# Patient Record
Sex: Male | Born: 1940 | Race: White | Hispanic: No | Marital: Married | Smoking: Former smoker
Health system: Southern US, Community
[De-identification: ages and names within clinical notes are randomized; demographics above are authoritative.]

## PROBLEM LIST (undated history)

## (undated) DIAGNOSIS — I10 Essential (primary) hypertension: Secondary | ICD-10-CM

## (undated) DIAGNOSIS — F410 Panic disorder [episodic paroxysmal anxiety] without agoraphobia: Secondary | ICD-10-CM

## (undated) DIAGNOSIS — N529 Male erectile dysfunction, unspecified: Secondary | ICD-10-CM

## (undated) DIAGNOSIS — J449 Chronic obstructive pulmonary disease, unspecified: Secondary | ICD-10-CM

## (undated) HISTORY — DX: Chronic obstructive pulmonary disease, unspecified: J44.9

## (undated) HISTORY — DX: Male erectile dysfunction, unspecified: N52.9

## (undated) HISTORY — DX: Panic disorder (episodic paroxysmal anxiety): F41.0

## (undated) HISTORY — PX: NO PAST SURGERIES: SHX2092

## (undated) HISTORY — DX: Essential (primary) hypertension: I10

---

## 2003-12-25 ENCOUNTER — Inpatient Hospital Stay (HOSPITAL_COMMUNITY): Admission: EM | Admit: 2003-12-25 | Discharge: 2003-12-28 | Payer: Self-pay | Admitting: Emergency Medicine

## 2013-04-13 ENCOUNTER — Encounter: Payer: Self-pay | Admitting: Pulmonary Disease

## 2013-04-13 ENCOUNTER — Ambulatory Visit (INDEPENDENT_AMBULATORY_CARE_PROVIDER_SITE_OTHER)
Admission: RE | Admit: 2013-04-13 | Discharge: 2013-04-13 | Disposition: A | Discharge status: Home / Self Care | Payer: Medicare Other | Source: Ambulatory Visit | Attending: Pulmonary Disease | Admitting: Pulmonary Disease

## 2013-04-13 ENCOUNTER — Ambulatory Visit (INDEPENDENT_AMBULATORY_CARE_PROVIDER_SITE_OTHER): Payer: Medicare Other | Admitting: Pulmonary Disease

## 2013-04-13 VITALS — BP 140/88 | HR 109 | Temp 97.9°F | Ht 72.0 in | Wt 215.0 lb

## 2013-04-13 DIAGNOSIS — J439 Emphysema, unspecified: Secondary | ICD-10-CM | POA: Insufficient documentation

## 2013-04-13 DIAGNOSIS — R0600 Dyspnea, unspecified: Secondary | ICD-10-CM

## 2013-04-13 DIAGNOSIS — R0609 Other forms of dyspnea: Secondary | ICD-10-CM

## 2013-04-13 DIAGNOSIS — R0989 Other specified symptoms and signs involving the circulatory and respiratory systems: Secondary | ICD-10-CM

## 2013-04-13 NOTE — Patient Instructions (Addendum)
Will check a chest xray today, and call you with results. Will start on Anoro one puff each am only.  Rinse mouth well. Do not use albuterol except for rescue/emergencies.  Before using, sit down, relax, and give it about 4-5 min to see if things improve. Will schedule for breathing studies within next 2-3 weeks, and would like to see you back on the same day to review.

## 2013-04-13 NOTE — Progress Notes (Signed)
  Subjective:    Patient ID: Trevor Carrillo, male    DOB: July 31, 1941, 72 y.o.   MRN: [id number]  HPI The patient is a 72 year old male who I've been asked to see for management of COPD.  The patient has had breathing issues for years, and was diagnosed with "COPD" during a hospitalization approximately 6 years ago.  He feels that his breathing has gotten progressively worse since that time, and now he can give short of breath just walking through his house at a slow pace.  He is unable to go up a flight of stairs without becoming severely short of breath.  He has a long history of smoking for over 40 years, but quit in 2006.  He has never had pulmonary function studies, and has not had a chest x-ray in 5 years.  He currently is on albuterol nebulizer b.i.d., and albuterol rescue inhaler as needed.  He has tried Advair and Spiriva in the past with some success, but then "became immune".  The patient denies having an episode of bronchitis or COPD exacerbation last year, and he has not had to go to the emergency room or the hospital in quite some time.  To his knowledge, he has no history of heart disease, but does not think that he has had any recent workup.  He does have chronic lower extremity edema.  He also states that his weight is gone up about 20 pounds over the last 2 years.   Review of Systems  Constitutional: Negative for fever and unexpected weight change.  HENT: Positive for congestion, rhinorrhea, sneezing and postnasal drip. Negative for ear pain, nosebleeds, sore throat, trouble swallowing, dental problem and sinus pressure.        Allergies  Eyes: Negative for redness and itching.  Respiratory: Positive for cough, chest tightness and shortness of breath. Negative for wheezing.   Cardiovascular: Positive for chest pain ( center chest//sternum). Negative for palpitations and leg swelling.  Gastrointestinal: Negative for nausea and vomiting.  Genitourinary: Negative for dysuria.   Musculoskeletal: Negative for joint swelling.  Skin: Negative for rash.  Neurological: Positive for headaches.  Hematological: Does not bruise/bleed easily.  Psychiatric/Behavioral: Negative for dysphoric mood. The patient is nervous/anxious.        Objective:   Physical Exam Constitutional:  Obese male, no acute distress  HENT:  Nares patent without discharge  Oropharynx without exudate, palate and uvula are elongated and swollen.   Eyes:  Perrla, eomi, no scleral icterus  Neck:  No JVD, no TMG  Cardiovascular:  Normal rate, regular rhythm, no rubs or gallops.  No murmurs        Intact distal pulses, but diminished.   Pulmonary :  decreased breath sounds, no stridor or respiratory distress   No rales, rhonchi, or wheezing  Abdominal:  Soft, nondistended, bowel sounds present.  No tenderness noted.   Musculoskeletal:  1+ lower extremity edema noted.  Lymph Nodes:  No cervical lymphadenopathy noted  Skin:  No cyanosis noted  Neurologic:  Alert, appropriate, moves all 4 extremities without obvious deficit.         Assessment & Plan:

## 2013-04-13 NOTE — Assessment & Plan Note (Signed)
The patient has severe dyspnea on exertion that I suspect is multifactorial.  He probably does have some degree of COPD, but will need pulmonary function studies to define this.  He also has very severe debility and deconditioning, and was barely able to walk even a few feet from a wheelchair to the exam table because of severe weakness and unsteadiness.  He also has severe anxiety at causes him to want to overuse his rescue inhaler, and also turn up his oxygen.  He may benefit from more aggressive treatment with anxiolytics.  Finally, he has significant lower extremity edema, which may be related to right heart failure, or possibly a component of left-sided failure.  Depending upon the results of his PFTs, he may need a cardiac evaluation as well.  I had a long discussion with the patient about long-acting bronchodilators versus short-acting ones.  I would like to try him on a LABA/LAMA combination to see if he sees improvement.

## 2013-04-14 NOTE — Progress Notes (Signed)
Quick Note:  Spoke with patient, informed him of reuslts as listed below per Dr. Shelle Iron Patient verbalized understanding and nothing further needed at this time. ______

## 2013-04-16 ENCOUNTER — Telehealth: Payer: Self-pay | Admitting: Pulmonary Disease

## 2013-04-16 NOTE — Telephone Encounter (Signed)
Pt was seen on 04-13-13 and was started on Anoro. He wanted to clarify if he was to continue his albuterol neb. I advised per patinet instruction he is to use albuterol as follows: Do not use albuterol except for rescue/emergencies. Before using, sit down, relax, and give it about 4-5 min to see if things improve.  I advised he is not to use it on a scheduled basis. Pt states understanding. Carron Curie, CMA

## 2013-05-03 ENCOUNTER — Telehealth: Payer: Self-pay | Admitting: Pulmonary Disease

## 2013-05-03 MED ORDER — UMECLIDINIUM-VILANTEROL 62.5-25 MCG/INH IN AEPB: 1.0000 | INHALATION_SPRAY | RESPIRATORY_TRACT | Status: DC

## 2013-05-03 NOTE — Telephone Encounter (Signed)
Spoke with patient, Patient last seen 04/13/13  Patient Instructions    Will check a chest xray today, and call you with results.  Will start on Anoro one puff each am only. Rinse mouth well.  Do not use albuterol except for rescue/emergencies. Before using, sit down, relax, and give it about 4-5 min to see if things improve.  Will schedule for breathing studies within next 2-3 weeks, and would like to see you back on the same day to review.    Patient was scheduled to return 05/10/13 w PFT, however patient has to cancel 05/10/13 OV will keep PFT Patient wants to know if Dr. Shelle Iron wants him to continue on Anoro until new appt 05/25/13, if so patient will need more samples as he states he has enough to last him until next Tuesday (6/16)  Dr. Shelle Iron pleasde advise, thank you

## 2013-05-03 NOTE — Telephone Encounter (Signed)
Keep apptm for pfts.  If anoro is helping him, would call in a prescription.

## 2013-05-03 NOTE — Telephone Encounter (Signed)
Spoke with pt and notified of recs per [facility] Rx was sent to pharm for Anoro Nothing further needed

## 2013-05-10 ENCOUNTER — Ambulatory Visit (INDEPENDENT_AMBULATORY_CARE_PROVIDER_SITE_OTHER): Payer: Medicare Other | Admitting: Pulmonary Disease

## 2013-05-10 ENCOUNTER — Ambulatory Visit: Payer: Medicare Other

## 2013-05-10 ENCOUNTER — Ambulatory Visit: Payer: Medicare Other | Admitting: Pulmonary Disease

## 2013-05-10 DIAGNOSIS — R0989 Other specified symptoms and signs involving the circulatory and respiratory systems: Secondary | ICD-10-CM

## 2013-05-10 DIAGNOSIS — R0600 Dyspnea, unspecified: Secondary | ICD-10-CM

## 2013-05-10 NOTE — Progress Notes (Signed)
PFT done today. 

## 2013-05-20 ENCOUNTER — Telehealth: Payer: Self-pay | Admitting: Pulmonary Disease

## 2013-05-20 NOTE — Telephone Encounter (Signed)
Pt was supposed to see me the same day as his pfts.  Needs ov to review pfts and check on things since starting inhaler.

## 2013-05-20 NOTE — Telephone Encounter (Signed)
Patient did have appt 05/10/13 after PFT but was moved d/t [facility] being out of office.  Patient was rescheduled to 05/25/13 @ 415

## 2013-05-25 ENCOUNTER — Ambulatory Visit (INDEPENDENT_AMBULATORY_CARE_PROVIDER_SITE_OTHER): Payer: Medicare Other | Admitting: Pulmonary Disease

## 2013-05-25 ENCOUNTER — Encounter: Payer: Self-pay | Admitting: Pulmonary Disease

## 2013-05-25 VITALS — BP 138/82 | HR 112 | Temp 98.3°F | Ht 73.0 in | Wt 220.0 lb

## 2013-05-25 DIAGNOSIS — J438 Other emphysema: Secondary | ICD-10-CM

## 2013-05-25 DIAGNOSIS — J439 Emphysema, unspecified: Secondary | ICD-10-CM

## 2013-05-25 MED ORDER — BUDESONIDE 0.25 MG/2ML IN SUSP: 0.2500 mg | Freq: Two times a day (BID) | RESPIRATORY_TRACT | Status: DC

## 2013-05-25 MED ORDER — IPRATROPIUM-ALBUTEROL 0.5-2.5 (3) MG/3ML IN SOLN: 3.0000 mL | RESPIRATORY_TRACT | Status: DC

## 2013-05-25 NOTE — Assessment & Plan Note (Signed)
The patient has severe airflow obstruction based on his recent PFTs.  We are still struggling to find a bronchodilator regimen that he will be tolerated, and at the same time not put him at risk for overuse of beta adrenergic drugs.  He clearly feels the need to take something every 4-6 hours, and therefore it may be that long-acting medications are not going to work for him.  At this point I would like to try him on duo nebs every 4-6 hours scheduled, and also add inhaled corticosteroids given the severely of his lung disease.  I also think he's going to have to work aggressively on weight loss and conditioning, and that his fluid status will need to be watched closely.

## 2013-05-25 NOTE — Progress Notes (Signed)
  Subjective:    Patient ID: Trevor Carrillo, male    DOB: Feb 13, 1941, 72 y.o.   MRN: [id number]  HPI The patient comes in today for followup of his recent pulmonary function studies.  He was found to have very severe airflow obstruction, with an FEV1 of 0.75 L, and an FEV1 percent of 38.  He did have a 15% improvement with bronchodilators.  He had no evidence for restriction, but severe air trapping noted.  His DLCO was severely reduced.  I have gone over the medications with him in detail, and answered all of his questions.  At the last visit, he was started on anoro, but the patient feels this is causing significant cough and shortness of breath.  He has continued to use his albuterol excessively.   Review of Systems  Constitutional: Negative for fever and unexpected weight change.  HENT: Negative for ear pain, nosebleeds, congestion, sore throat, rhinorrhea, sneezing, trouble swallowing, dental problem, postnasal drip and sinus pressure.   Eyes: Negative for redness and itching.  Respiratory: Negative for cough, chest tightness, shortness of breath and wheezing.   Cardiovascular: Negative for palpitations and leg swelling.  Gastrointestinal: Negative for nausea and vomiting.  Genitourinary: Negative for dysuria.  Musculoskeletal: Negative for joint swelling.  Skin: Negative for rash.  Neurological: Negative for headaches.  Hematological: Does not bruise/bleed easily.  Psychiatric/Behavioral: Negative for dysphoric mood. The patient is not nervous/anxious.        Objective:   Physical Exam Obese male in no acute distress Nose without purulence or discharge noted Neck without lymphadenopathy or thyromegaly Chest with a few scattered crackles, but very decreased breath sounds but no wheezing. Current exam with regular rate and rhythm Lower extremities with 1+ edema, no cyanosis Alert and oriented, moves all 4 extremities.       Assessment & Plan:

## 2013-05-25 NOTE — Patient Instructions (Addendum)
Stop all breathing medication. Start duoneb in your nebulizer 4 times a day, and can take 2 extra if having a bad day. Start budesonide 0.25 mg in nebulizer 4 times a day.  Do not add this to the extra treatments. Can use albuterol inhaler for emergencies only if away from home.   Work on weight loss followup with me in 3mos.

## 2013-05-26 ENCOUNTER — Telehealth: Payer: Self-pay | Admitting: Pulmonary Disease

## 2013-05-26 NOTE — Telephone Encounter (Signed)
[facility] and then try and transfer to [facility]. Wife to call once pt gets through first month supply to see if [facility] will accept the Rx.  Rx for both the Pulmicort and Duoneb [facility] at [phone]

## 2013-05-26 NOTE — Telephone Encounter (Signed)
ATC x 1  NA  We have the handwritten Rx signed by [facility] here at the office.  I tried calling the patient to let them know of the options they have for these rxs.  Melissa with [facility] states that they are having to redo contract with [facility] and at this time they are not accepting Rx from this pt insurance company. This issue should be fixed within ONE MONTH.   Options: 1) [facility].  2) [facility] 1 month until [facility] get their pharmacy issues straightened out then we may start refilling through [facility] 3) Get a new DME for this medication perm. 4) Get new DME to cover meds until [facility] gets the new pharmacy contract.

## 2013-05-31 ENCOUNTER — Telehealth: Payer: Self-pay | Admitting: Pulmonary Disease

## 2013-05-31 NOTE — Telephone Encounter (Signed)
He can mix budesonide with the duoneb at his 4 scheduled treatments.

## 2013-05-31 NOTE — Telephone Encounter (Signed)
Pt wants to make sure that he can mix duoneb and budesonide to be used at the same time.  He is aware not to add budesonide with extra duoneb treatments but wants to make usre it is ok to mix the two.  Please advise.  Per KC at last ov:   Stop all breathing medication. Start duoneb in your nebulizer 4 times a day, and can take 2 extra if having a bad day. Start budesonide 0.25 mg in nebulizer 4 times a day.  Do not add this to the extra treatments. Can use albuterol inhaler for emergencies only if away from home.    Work on weight loss followup with me in 3mos.

## 2013-05-31 NOTE — Telephone Encounter (Signed)
Spouse aware of KC recs. She voiced her understanding and needed nothing further

## 2013-06-16 ENCOUNTER — Encounter: Payer: Self-pay | Admitting: Pulmonary Disease

## 2013-08-25 ENCOUNTER — Ambulatory Visit (INDEPENDENT_AMBULATORY_CARE_PROVIDER_SITE_OTHER): Payer: Medicare Other | Admitting: Pulmonary Disease

## 2013-08-25 ENCOUNTER — Encounter: Payer: Self-pay | Admitting: Pulmonary Disease

## 2013-08-25 VITALS — BP 124/62 | HR 112 | Temp 97.8°F

## 2013-08-25 DIAGNOSIS — J438 Other emphysema: Secondary | ICD-10-CM

## 2013-08-25 DIAGNOSIS — J439 Emphysema, unspecified: Secondary | ICD-10-CM

## 2013-08-25 DIAGNOSIS — Z23 Encounter for immunization: Secondary | ICD-10-CM

## 2013-08-25 NOTE — Assessment & Plan Note (Signed)
The patient has severe COPD, but appears to be near his usual baseline.  I've asked him to continue his nebulized medication since he seems to be doing better on days.  He is very weak and debilitated, and I have recommended pulmonary rehabilitation.  The patient decided against this.  He tells me that he is using 5+ liters of oxygen at night, and I have cautioned him about excessive oxygen use.  Would like to check his overnight oximetry to verify his oxygen need.

## 2013-08-25 NOTE — Patient Instructions (Addendum)
Will have your home care company check your oxygen level overnight on 4 liters.  I will call you once I receive the download. Try and make your first breathing treatment in the am as soon as you wake up. Will give you the flu shot today. Work on weight loss and some type of exercise program.  Think about pulmonary rehab program at [facility].  followup with me in 4mos if doing well.

## 2013-08-25 NOTE — Progress Notes (Signed)
  Subjective:    Patient ID: Trevor Carrillo, male    DOB: [birth year], 72 y.o.   MRN: [id number]  HPI Patient comes in today for followup of his known COPD with chronic respiratory failure.  He had a somewhat difficult summer because of the high heat and humidity, but feels that his breathing is the same now as it was approximately 3-4 months ago.  He has not had any chest congestion or purulent mucus.  He is waking up in the morning with significant increased shortness of breath, but uses his albuterol inhaler rather than his nebulizer machine.  I have asked him to make his first treatment of the day when he first awakens.   Review of Systems  Constitutional: Negative for fever and unexpected weight change.  HENT: Negative for ear pain, nosebleeds, congestion, sore throat, rhinorrhea, sneezing, trouble swallowing, dental problem, postnasal drip and sinus pressure.   Eyes: Negative for redness and itching.  Respiratory: Positive for shortness of breath. Negative for cough, chest tightness and wheezing.   Cardiovascular: Negative for palpitations and leg swelling.  Gastrointestinal: Negative for nausea and vomiting.  Genitourinary: Negative for dysuria.  Musculoskeletal: Negative for joint swelling.  Skin: Negative for rash.  Neurological: Negative for headaches.  Hematological: Does not bruise/bleed easily.  Psychiatric/Behavioral: Positive for sleep disturbance ( difficulty breathing). Negative for dysphoric mood. The patient is not nervous/anxious.        Objective:   Physical Exam Obese male in no acute distress Nose without purulence or discharge noted Neck without lymphadenopathy or thyromegaly Chest with decreased breath sounds, but no active wheezing Cardiac exam with distant heart sounds but regular Lower extremities with 1+ edema, no cyanosis Alert and oriented, moves all 4 extremities.       Assessment & Plan:

## 2013-09-02 ENCOUNTER — Telehealth: Payer: Self-pay | Admitting: *Deleted

## 2013-09-02 NOTE — Telephone Encounter (Signed)
ONO results have been received and placed in KC Braylie Badami folder for review. Please advise, thanks.   

## 2013-09-06 ENCOUNTER — Telehealth: Payer: Self-pay | Admitting: Pulmonary Disease

## 2013-09-06 DIAGNOSIS — J439 Emphysema, unspecified: Secondary | ICD-10-CM

## 2013-09-06 NOTE — Telephone Encounter (Signed)
Please let pt know that his overnight test shows adequate oxygen level overnight on 4L oxygen.  Will stay at this level.

## 2013-09-07 NOTE — Telephone Encounter (Signed)
Results have been explained to patient, pt expressed understanding. Nothing further needed.  

## 2013-10-11 ENCOUNTER — Telehealth: Payer: Self-pay | Admitting: Pulmonary Disease

## 2013-10-11 DIAGNOSIS — J449 Chronic obstructive pulmonary disease, unspecified: Secondary | ICD-10-CM

## 2013-10-11 NOTE — Telephone Encounter (Signed)
I spoke with spouse. She is wanting to know how much [facility] will charge pt for budesonide. She reports she was told to call us to find out. Please advise PCC's thanks

## 2013-10-11 NOTE — Telephone Encounter (Signed)
According to med list pt has albuterol neb, duoneb, and budesonide on there. Per Bjorn Loser the spouse states pt has all 3. Please advise KC thanks

## 2013-10-11 NOTE — Telephone Encounter (Signed)
Called and spoke with Synetta Fail at [facility] and she advised that a 1 month supply with pt's insurance would be around $60 give or take. No way to be sure until they ran the Rx.  Called and spoke with patient's wife and she stated that was much better than what they were paying. Wife is requesting that all his nebulizer medications be sent to [facility]. Please place a referral for DME ([facility]) to provide nebulizer meds along with Rx and send order with Rx to [facility]. Thanks. Rhonda J Cobb

## 2013-10-12 NOTE — Telephone Encounter (Signed)
The budesonide is 4 times a day with duoneb, sorry.

## 2013-10-12 NOTE — Telephone Encounter (Signed)
Pt just needs duoneb to use 4 times a day , and 2 additional if needed.  Does not need plain albuterol Use budesonide twice a day only mixed in with duoneb

## 2013-10-13 MED ORDER — BUDESONIDE 0.25 MG/2ML IN SUSP: 0.2500 mg | Freq: Four times a day (QID) | RESPIRATORY_TRACT | Status: DC

## 2013-10-13 MED ORDER — IPRATROPIUM-ALBUTEROL 0.5-2.5 (3) MG/3ML IN SOLN: 3.0000 mL | Freq: Four times a day (QID) | RESPIRATORY_TRACT | Status: DC

## 2013-10-13 NOTE — Telephone Encounter (Signed)
I have printed off RX's and placed on [facility] desk for signature then will give to PCC's. Please advise KC thanks

## 2013-12-24 ENCOUNTER — Ambulatory Visit: Payer: Medicare Other | Admitting: Pulmonary Disease

## 2014-01-18 ENCOUNTER — Ambulatory Visit: Payer: Self-pay | Admitting: Pulmonary Disease

## 2014-02-07 ENCOUNTER — Encounter: Payer: Self-pay | Admitting: Pulmonary Disease

## 2014-02-07 ENCOUNTER — Ambulatory Visit (INDEPENDENT_AMBULATORY_CARE_PROVIDER_SITE_OTHER): Payer: Medicare Other | Admitting: Pulmonary Disease

## 2014-02-07 VITALS — BP 138/76 | HR 108 | Temp 98.6°F

## 2014-02-07 DIAGNOSIS — J439 Emphysema, unspecified: Secondary | ICD-10-CM

## 2014-02-07 DIAGNOSIS — J438 Other emphysema: Secondary | ICD-10-CM

## 2014-02-07 DIAGNOSIS — J9610 Chronic respiratory failure, unspecified whether with hypoxia or hypercapnia: Secondary | ICD-10-CM | POA: Insufficient documentation

## 2014-02-07 MED ORDER — LORAZEPAM 2 MG PO TABS: 1.0000 mg | ORAL_TABLET | Freq: Every day | ORAL | Status: DC

## 2014-02-07 MED ORDER — PREDNISONE 10 MG PO TABS: ORAL_TABLET | ORAL | Status: DC

## 2014-02-07 NOTE — Addendum Note (Signed)
Addended by: Maisie FusGREEN, Malaney Mcbean M on: 02/07/2014 05:12 PM   Modules accepted: Orders

## 2014-02-07 NOTE — Progress Notes (Signed)
   Subjective:    Patient ID: Trevor Carrillo, male    DOB: 04/28/1941, 73 y.o.   MRN: [id number]  HPI The patient comes in today for followup of his known COPD with chronic respiratory failure. He has very severe disease with a lot of anxiety, and tends to overuse his beta agonist. He was switched completely over to nebulized bronchodilators at the last visit, but has continued to use albuterol on top of duoneb.  I have cautioned him again about this. He denies any significant congestion, cough, or purulent mucus. He feels that his breathing is a little worse than baseline, but his oxygenation is more than adequate.   Review of Systems  Constitutional: Negative for fever and unexpected weight change.  HENT: Negative for congestion, dental problem, ear pain, nosebleeds, postnasal drip, rhinorrhea, sinus pressure, sneezing, sore throat and trouble swallowing.   Eyes: Negative for redness and itching.  Respiratory: Positive for cough, chest tightness, shortness of breath and wheezing.   Cardiovascular: Positive for chest pain ( with SOB). Negative for palpitations and leg swelling.  Gastrointestinal: Negative for nausea and vomiting.  Genitourinary: Negative for dysuria.  Musculoskeletal: Negative for joint swelling.  Skin: Negative for rash.  Neurological: Negative for headaches.  Hematological: Does not bruise/bleed easily.  Psychiatric/Behavioral: Negative for dysphoric mood. The patient is not nervous/anxious.        Objective:   Physical Exam Obese male in no acute distress Nose without purulence or discharge noted Neck without lymphadenopathy or thyromegaly Chest with decreased breath sounds throughout, no wheezing Cardiac exam with regular rate and rhythm, 2/6 systolic murmur Lower extremities with mild edema, no cyanosis Alert and oriented, moves all 4 extremities.       Assessment & Plan:

## 2014-02-07 NOTE — Assessment & Plan Note (Signed)
The patient has severe disease, and has a significant anxiety component to his symptoms. I will try to increase his lorazepam by adding a milligram dose at lunch, and will also start on prednisone chronically at 10 mg a day for his breathing. I have cautioned him again about over using his beta agonist, and have also recommended that he attend pulmonary rehabilitation. At this point, there is very little that we can do except starting him on narcotics for dyspnea relief if he does not respond to this regimen. He would need to be on hospice at that point.

## 2014-02-07 NOTE — Patient Instructions (Addendum)
Do not ever use plain albuterol in your nebulizer.  That is what the duoneb is for.  You can take duoneb 4 times a day, then 2 additional if having a bad day.  Take lorazepam 2mg  in am and pm as you are doing, but would add a 1mg  dose (1/2 pill) around 12-1pm each day Will start on prednisone 10mg  each am on full stomach Please consider going to [facility] rehab program [facility] for your breathing.  Let me know.  followup with me again in 2mos to check on things.

## 2014-04-13 ENCOUNTER — Ambulatory Visit: Payer: Medicare Other | Admitting: Pulmonary Disease

## 2014-05-02 ENCOUNTER — Encounter: Payer: Self-pay | Admitting: Pulmonary Disease

## 2014-05-02 ENCOUNTER — Ambulatory Visit (INDEPENDENT_AMBULATORY_CARE_PROVIDER_SITE_OTHER): Payer: Medicare Other | Admitting: Pulmonary Disease

## 2014-05-02 VITALS — BP 132/88 | HR 118 | Temp 98.0°F

## 2014-05-02 DIAGNOSIS — J9610 Chronic respiratory failure, unspecified whether with hypoxia or hypercapnia: Secondary | ICD-10-CM

## 2014-05-02 DIAGNOSIS — J438 Other emphysema: Secondary | ICD-10-CM

## 2014-05-02 DIAGNOSIS — J439 Emphysema, unspecified: Secondary | ICD-10-CM

## 2014-05-02 MED ORDER — LORAZEPAM 1 MG PO TABS: ORAL_TABLET | ORAL | Status: DC

## 2014-05-02 MED ORDER — ALBUTEROL SULFATE HFA 108 (90 BASE) MCG/ACT IN AERS: 2.0000 | INHALATION_SPRAY | Freq: Four times a day (QID) | RESPIRATORY_TRACT | Status: DC | PRN

## 2014-05-02 NOTE — Progress Notes (Signed)
   Subjective:    Patient ID: Trevor Carrillo, male    DOB: 1941-07-02, 73 y.o.   MRN: [id number]  HPI The patient comes in today for followup of his known severe COPD with chronic respiratory failure. He was started on chronic prednisone at 10 mg a day at the last visit, and we were also working on improving his anxiety. Unfortunately, he never got his extra dose of Ativan, and this will need to be taken care of this visit. He denies any significant cough or congestion, and is not bringing up purulent mucus.   Review of Systems  Constitutional: Negative for fever and unexpected weight change.  HENT: Negative for congestion, dental problem, ear pain, nosebleeds, postnasal drip, rhinorrhea, sinus pressure, sneezing, sore throat and trouble swallowing.   Eyes: Negative for redness and itching.  Respiratory: Positive for cough and shortness of breath. Negative for chest tightness and wheezing.   Cardiovascular: Negative for palpitations and leg swelling.  Gastrointestinal: Negative for nausea and vomiting.  Genitourinary: Negative for dysuria.  Musculoskeletal: Negative for joint swelling.  Skin: Negative for rash.  Neurological: Negative for headaches.  Hematological: Does not bruise/bleed easily.  Psychiatric/Behavioral: Negative for dysphoric mood. The patient is not nervous/anxious.        Objective:   Physical Exam Obese male in no acute distress Nose without purulence or discharge noted Neck without lymphadenopathy or thyromegaly Chest with very diminished breath sounds, no active wheezing Cardiac exam with regular rate and rhythm Lower extremities with mild edema, no cyanosis Alert and oriented, moves all 4 extremities.       Assessment & Plan:

## 2014-05-02 NOTE — Assessment & Plan Note (Signed)
The patient appears to be at his usual baseline from a COPD and chronic respiratory failure standpoint. I have asked him to continue on his nebulized bronchodilators, as well as his chronic prednisone at 10 mg a day. Will try and control his anxiety a little better with an extra dose at midday. The patient has end-stage disease, and will probably need hospice in the very near future.

## 2014-05-02 NOTE — Patient Instructions (Addendum)
No change in medications Will print a prescription for your ativan dose of 1mg  to take midday.  (#30, 6 fills) Try and stay active followup with me again in 51mos.

## 2014-05-02 NOTE — Addendum Note (Signed)
Addended by: Nicanor Alcon on: 05/02/2014 05:15 PM   Modules accepted: Orders

## 2014-05-02 NOTE — Addendum Note (Signed)
Addended by: Nicanor Alcon on: 05/02/2014 05:24 PM   Modules accepted: Orders

## 2014-05-13 IMAGING — CR DG CHEST 2V
2 series · 2 of 2 positions shown · non-contrast
Comparison: Chest radiograph 12/26/2003

CLINICAL DATA: Short of breath, dyspnea

CHEST - 2 VIEW

[view not recorded (1 of 2)]
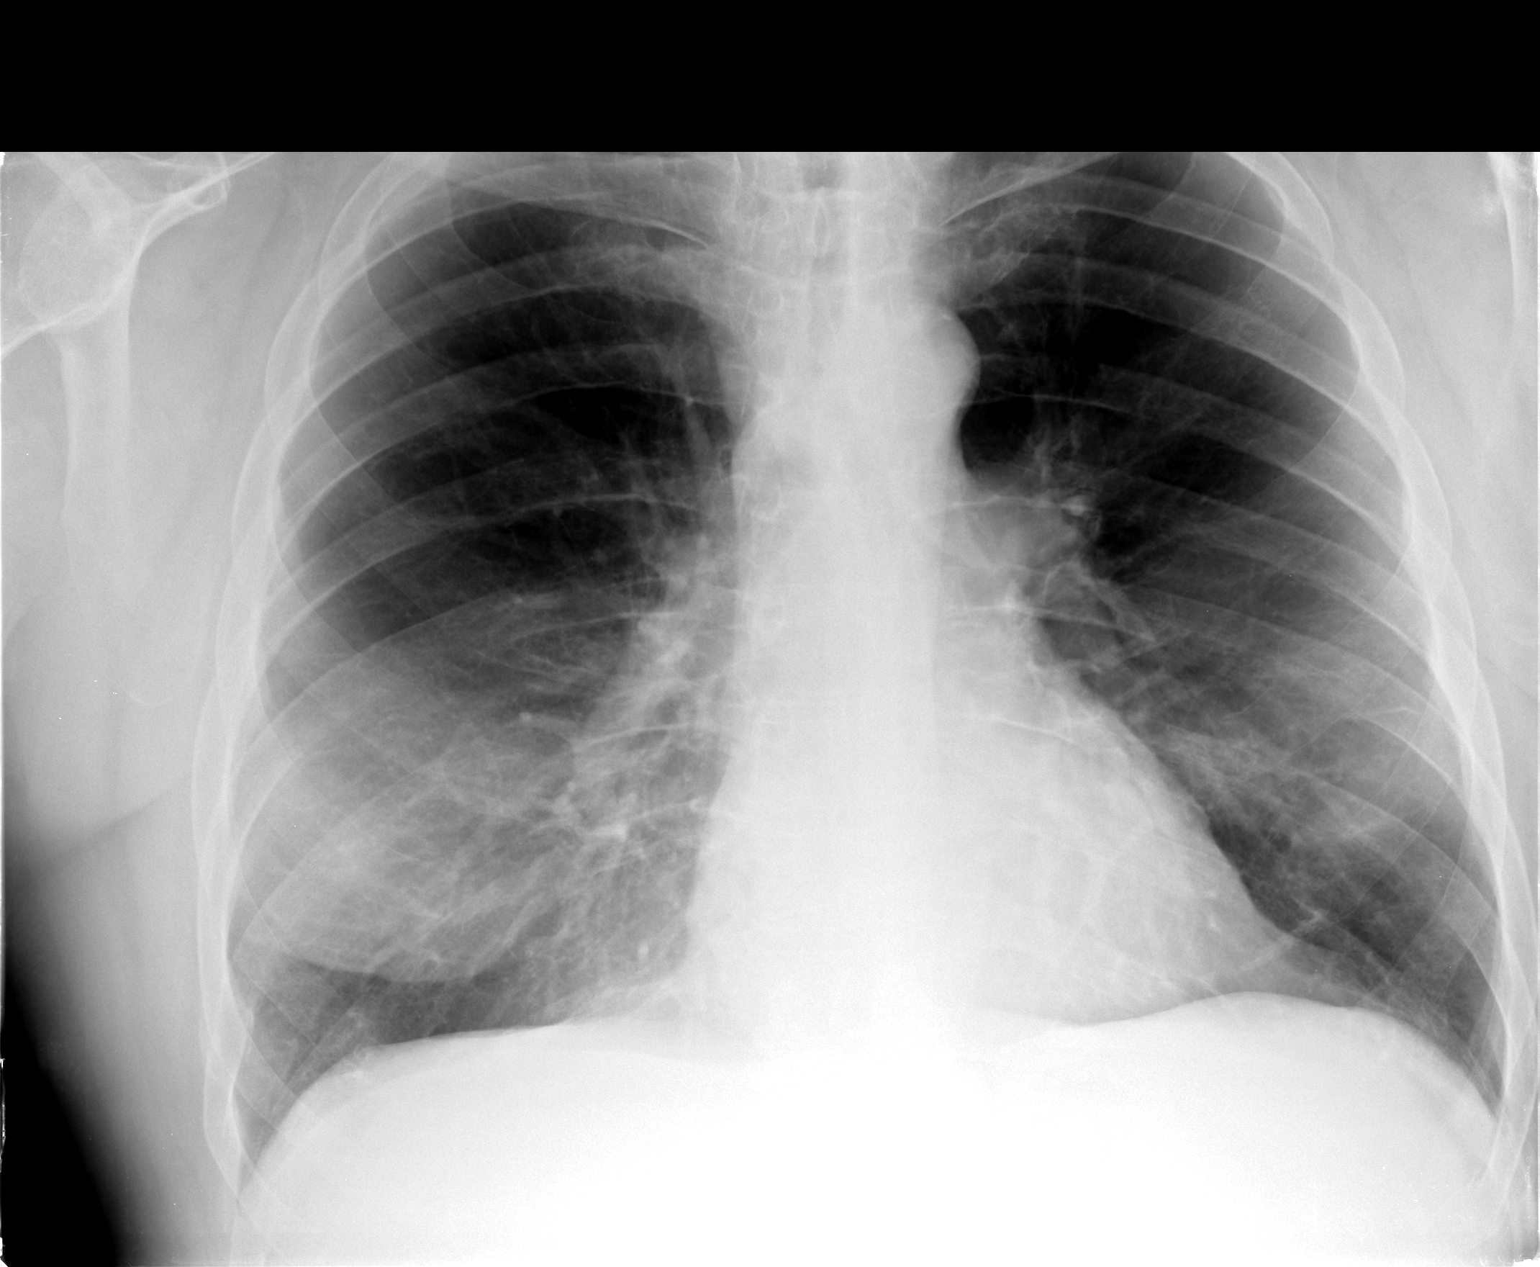

[view not recorded (2 of 2)]
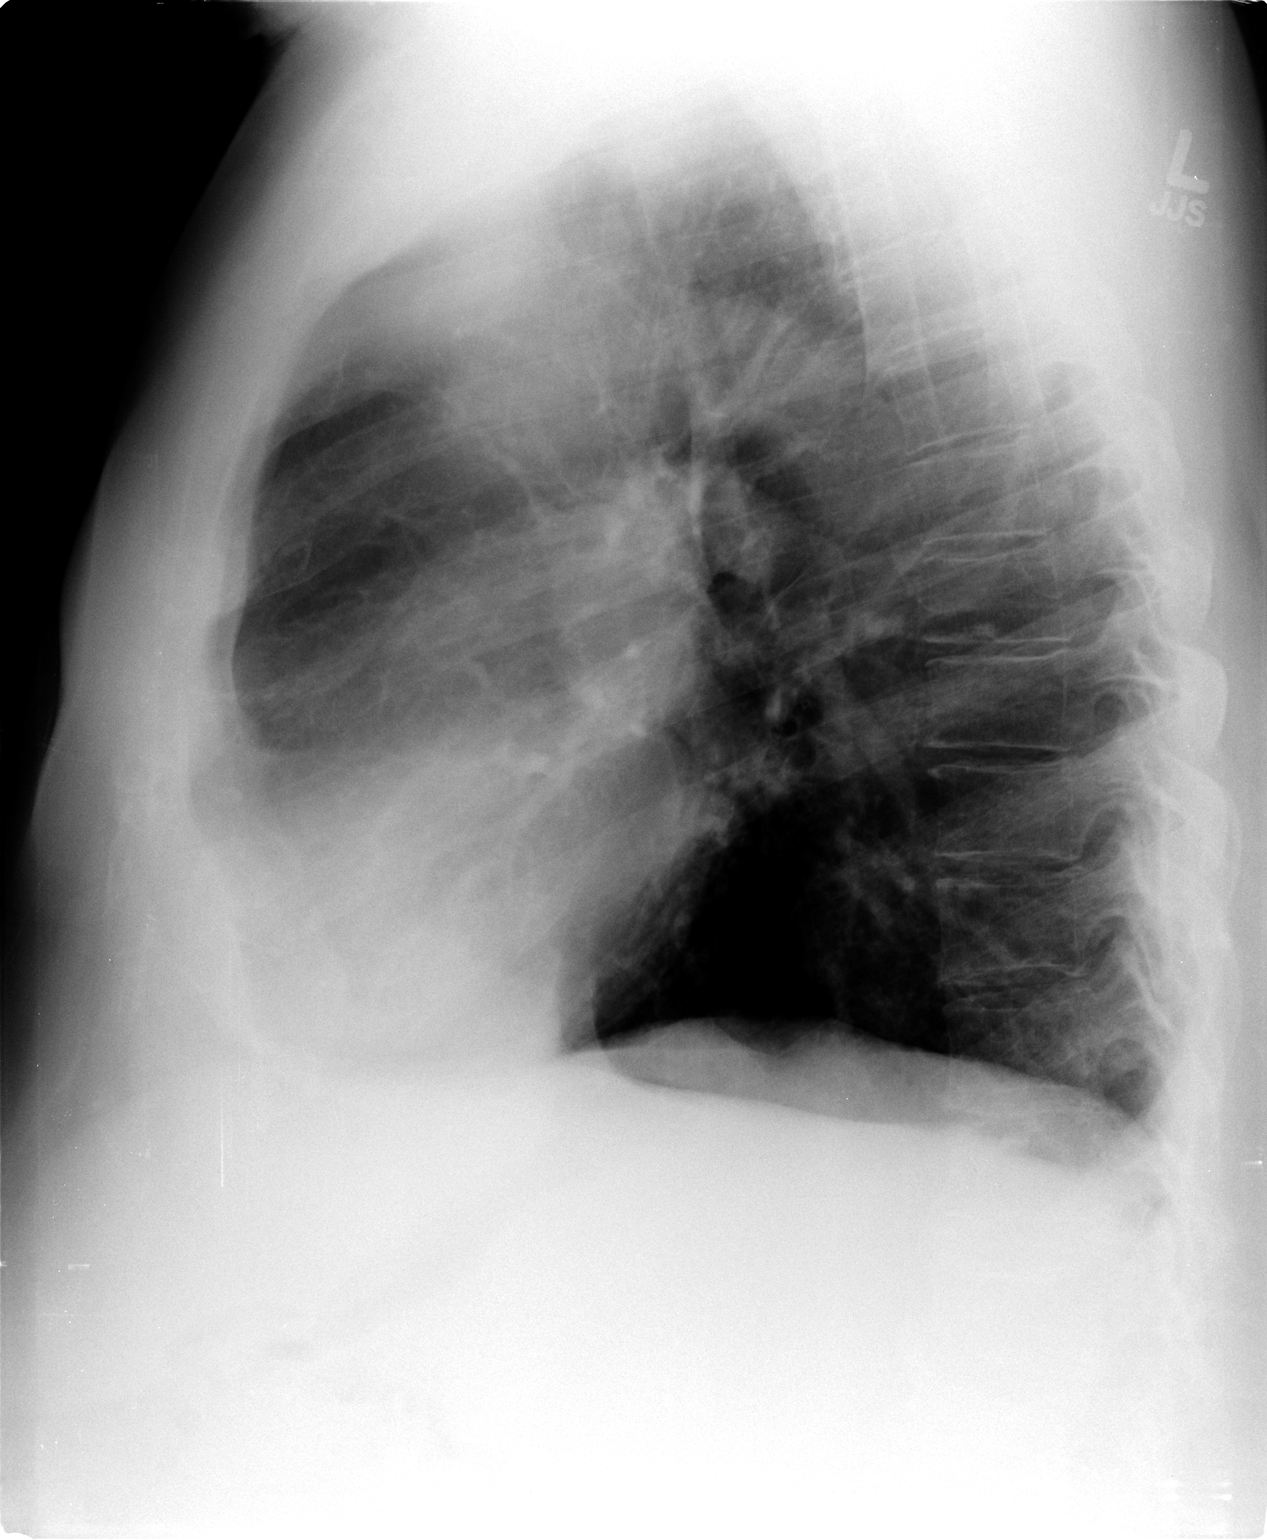

[2 of 2 positions shown; findings below may reference images not displayed]

FINDINGS: Normal cardiac silhouette.  Lungs are hyperinflated with
flattening of the hemidiaphragms.  No effusion, infiltrate, or
pneumothorax.
IMPRESSION: Emphysematous change.  No acute findings.

## 2014-05-18 ENCOUNTER — Other Ambulatory Visit: Payer: Self-pay | Admitting: Pulmonary Disease

## 2014-05-31 ENCOUNTER — Other Ambulatory Visit: Payer: Self-pay | Admitting: Pulmonary Disease

## 2014-06-01 ENCOUNTER — Other Ambulatory Visit: Payer: Self-pay | Admitting: Pulmonary Disease

## 2014-06-03 ENCOUNTER — Telehealth: Payer: Self-pay | Admitting: Pulmonary Disease

## 2014-06-03 MED ORDER — BUDESONIDE 0.25 MG/2ML IN SUSP: 0.2500 mg | Freq: Four times a day (QID) | RESPIRATORY_TRACT | Status: DC

## 2014-06-03 MED ORDER — ALBUTEROL SULFATE HFA 108 (90 BASE) MCG/ACT IN AERS: 2.0000 | INHALATION_SPRAY | Freq: Four times a day (QID) | RESPIRATORY_TRACT | Status: DC | PRN

## 2014-06-03 NOTE — Telephone Encounter (Signed)
According to epic this was already called in.  I called made spouse aware. Nothing further needed

## 2014-06-03 NOTE — Telephone Encounter (Signed)
I called pt. She reports she picked up her [facility]. She reports she [facility]olin [facility] and [facility] neb [facility]. I have done so. Nothing further needed

## 2014-09-01 ENCOUNTER — Telehealth: Payer: Self-pay | Admitting: Pulmonary Disease

## 2014-09-01 ENCOUNTER — Other Ambulatory Visit: Payer: Self-pay | Admitting: Pulmonary Disease

## 2014-09-01 MED ORDER — ALBUTEROL SULFATE HFA 108 (90 BASE) MCG/ACT IN AERS: 2.0000 | INHALATION_SPRAY | Freq: Four times a day (QID) | RESPIRATORY_TRACT | Status: DC | PRN

## 2014-09-01 NOTE — Telephone Encounter (Signed)
Pt aware rx has been sent. Nothing further needed at this time.

## 2014-09-01 NOTE — Telephone Encounter (Signed)
ATC line rang several times and no answer, no VM WCB  RX sent in for ventolin

## 2014-09-05 ENCOUNTER — Ambulatory Visit (INDEPENDENT_AMBULATORY_CARE_PROVIDER_SITE_OTHER): Payer: Medicare Other | Admitting: Pulmonary Disease

## 2014-09-05 ENCOUNTER — Encounter: Payer: Self-pay | Admitting: Pulmonary Disease

## 2014-09-05 VITALS — BP 122/62 | HR 117 | Temp 98.1°F

## 2014-09-05 DIAGNOSIS — J9611 Chronic respiratory failure with hypoxia: Secondary | ICD-10-CM

## 2014-09-05 DIAGNOSIS — J439 Emphysema, unspecified: Secondary | ICD-10-CM

## 2014-09-05 NOTE — Progress Notes (Signed)
   Subjective:    Patient ID: Renita PapaDodson R Kotarski, male    DOB: 05/26/1941, 73 y.o.   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 He is prednisone dependent, and was changed over to nebulized bronchodilators. He also has a severe anxiety component to his illness, and is doing better with the addition of well to trying to his benzodiazepines. Since the last visit, he feels things have stabilized, although he has severe dyspnea with any activity. His wife is concerned because his blood sugars have been in the 170 range on chronic prednisone.   Review of Systems  Constitutional: Negative for fever and unexpected weight change.  HENT: Negative for congestion, dental problem, ear pain, nosebleeds, postnasal drip, rhinorrhea, sinus pressure, sneezing, sore throat and trouble swallowing.   Eyes: Negative for redness and itching.  Respiratory: Positive for shortness of breath. Negative for cough, chest tightness and wheezing.   Cardiovascular: Negative for palpitations and leg swelling.  Gastrointestinal: Negative for nausea and vomiting.  Genitourinary: Negative for dysuria.  Musculoskeletal: Negative for joint swelling.  Skin: Negative for rash.  Neurological: Negative for headaches.  Hematological: Does not bruise/bleed easily.  Psychiatric/Behavioral: Negative for dysphoric mood. The patient is not nervous/anxious.        Objective:   Physical Exam Obese male in no acute distress Nose without purulence or discharge noted Neck without lymphadenopathy or thyromegaly Chest with very diminished breath sounds throughout, no wheezing Cardiac exam with regular rate and rhythm Lower extremities 1+ ankle edema, no cyanosis Alert and oriented, moves all 4 extremities.       Assessment & Plan:

## 2014-09-05 NOTE — Assessment & Plan Note (Signed)
The patient appears to be at a stable baseline from the last visit on chronic prednisone, nebulized bronchodilators, and a very aggressive anxiety treatment regimen. I have asked him to do everything he can to try and drop some weight, and also to try and walk behind a walker in his home as much as possible. I will decrease his prednisone down to 5 mg a day and see how he responds.

## 2014-09-05 NOTE — Patient Instructions (Signed)
Decrease prednisone to 5mg  a day since it may be affecting your blood sugars.  If you see your breathing getting worse, can try 7.5mg  a day . No change in your breathing medications. Try and get some kind of activity, and work on weight loss followup with me again in 4mos.

## 2014-09-08 ENCOUNTER — Telehealth: Payer: Self-pay | Admitting: Pulmonary Disease

## 2014-09-08 MED ORDER — PREDNISONE 10 MG PO TABS: ORAL_TABLET | ORAL | Status: DC

## 2014-09-08 NOTE — Telephone Encounter (Signed)
Spoke with pt's wife and advised that rx for Prednisone was sent to pharmacy per pt request.

## 2014-12-01 ENCOUNTER — Telehealth: Payer: Self-pay | Admitting: Pulmonary Disease

## 2014-12-01 MED ORDER — ALBUTEROL SULFATE HFA 108 (90 BASE) MCG/ACT IN AERS
2.0000 | INHALATION_SPRAY | Freq: Four times a day (QID) | RESPIRATORY_TRACT | Status: DC | PRN
Start: 2014-12-01 — End: 2015-01-04

## 2014-12-01 MED ORDER — IPRATROPIUM-ALBUTEROL 0.5-2.5 (3) MG/3ML IN SOLN: RESPIRATORY_TRACT | Status: DC

## 2014-12-01 NOTE — Telephone Encounter (Signed)
Called spoke with pt spouse. She needs duoneb sent to [facility] and pt albuterol inhaler [facility] to [facility]. I have done so. Nothing further needed

## 2015-01-03 ENCOUNTER — Ambulatory Visit (INDEPENDENT_AMBULATORY_CARE_PROVIDER_SITE_OTHER): Payer: Medicare Other | Admitting: Pulmonary Disease

## 2015-01-03 ENCOUNTER — Encounter: Payer: Self-pay | Admitting: Pulmonary Disease

## 2015-01-03 VITALS — BP 134/76 | HR 113 | Temp 97.6°F

## 2015-01-03 DIAGNOSIS — J438 Other emphysema: Secondary | ICD-10-CM

## 2015-01-03 DIAGNOSIS — J9611 Chronic respiratory failure with hypoxia: Secondary | ICD-10-CM

## 2015-01-03 NOTE — Progress Notes (Signed)
   Subjective:    Patient ID: Trevor Carrillo, male    DOB: 09/24/1941, 74 y.o.   MRN: [id number]  HPI The patient comes in today for follow-up of his known end-stage COPD with chronic respiratory failure. He has actually remained fairly stable from the last visit, and continues on chronic prednisone and an anxiolytic. He has not had an acute exacerbation or pulmonary infection according to his history.  He continues on his oxygen therapy, and feels that his dyspnea on exertion continues to be an issue. Denies any significant cough or mucus.   Review of Systems  Constitutional: Negative for fever and unexpected weight change.  HENT: Negative for congestion, dental problem, ear pain, nosebleeds, postnasal drip, rhinorrhea, sinus pressure, sneezing, sore throat and trouble swallowing.   Eyes: Negative for redness and itching.  Respiratory: Positive for cough and shortness of breath. Negative for chest tightness and wheezing.   Cardiovascular: Negative for palpitations and leg swelling.  Gastrointestinal: Negative for nausea and vomiting.  Genitourinary: Negative for dysuria.  Musculoskeletal: Negative for joint swelling.  Skin: Negative for rash.  Neurological: Negative for headaches.  Hematological: Does not bruise/bleed easily.  Psychiatric/Behavioral: Negative for dysphoric mood. The patient is not nervous/anxious.        Objective:   Physical Exam Obese male in no acute distress Nose without purulence or discharge noted Neck without lymphadenopathy or thyromegaly Chest with very diminished breath sounds, no definite wheeze Cardiac exam with very distant heart sounds, but sounds regular. Lower extremities with mild edema, no cyanosis Alert and oriented, moves all 4 extremities.       Assessment & Plan:

## 2015-01-03 NOTE — Patient Instructions (Signed)
No change in medications Continue prednisone between 5-10mg  a day, depending upon how you are breathing on that day. Try and stay as active as possible. followup with me again in 4mos.

## 2015-01-03 NOTE — Assessment & Plan Note (Signed)
The patient has very severe COPD that is really end-stage, and he continues to do better than expected on his current regimen. I have explained to him that he is basically on maximal therapy, including chronic prednisone and anxiolytics. I have asked him to continue on his current medication, and to try and be as active as he can. If we can keep him out of the hospital in the emergency room moving forward, I think we're doing well.

## 2015-01-04 MED ORDER — BUDESONIDE 0.25 MG/2ML IN SUSP: 0.2500 mg | Freq: Four times a day (QID) | RESPIRATORY_TRACT | Status: DC

## 2015-01-04 MED ORDER — ALBUTEROL SULFATE HFA 108 (90 BASE) MCG/ACT IN AERS
2.0000 | INHALATION_SPRAY | Freq: Four times a day (QID) | RESPIRATORY_TRACT | Status: DC | PRN
Start: 2015-01-04 — End: 2015-09-08

## 2015-01-04 MED ORDER — IPRATROPIUM-ALBUTEROL 0.5-2.5 (3) MG/3ML IN SOLN: RESPIRATORY_TRACT | Status: AC

## 2015-01-04 NOTE — Addendum Note (Signed)
Addended by: Tommie SamsSILVA, Vonte Rossin S on: 01/04/2015 10:43 AM   Modules accepted: Orders

## 2015-01-31 ENCOUNTER — Telehealth: Payer: Self-pay | Admitting: Pulmonary Disease

## 2015-01-31 NOTE — Telephone Encounter (Signed)
Ok for #30, 3 fills.

## 2015-01-31 NOTE — Telephone Encounter (Signed)
Last OV 01/03/15 Pending 05/05/15 Pt last had refill on ativan 1 mg 06/03/14 #30 x 5 refills Please advise on refills thanks

## 2015-02-01 NOTE — Telephone Encounter (Signed)
[facility] Nothing further was needed.

## 2015-03-06 ENCOUNTER — Telehealth: Payer: Self-pay | Admitting: Pulmonary Disease

## 2015-03-06 NOTE — Telephone Encounter (Signed)
Spoke with pt's wife. States that Perfor[facility]ist will cost them $596 a month. Advised the pt's wife that we have samples here in the office we would be happy to give them. Nothing further was needed.

## 2015-03-09 ENCOUNTER — Telehealth: Payer: Self-pay | Admitting: Pulmonary Disease

## 2015-03-09 NOTE — Telephone Encounter (Signed)
Pt's wife came by. Message not needed due to she only wants to thank Mardella LaymanLindsey and give her gift/spm

## 2015-03-14 ENCOUNTER — Other Ambulatory Visit: Payer: Self-pay | Admitting: Pulmonary Disease

## 2015-05-05 ENCOUNTER — Encounter: Payer: Self-pay | Admitting: Pulmonary Disease

## 2015-05-05 ENCOUNTER — Ambulatory Visit (INDEPENDENT_AMBULATORY_CARE_PROVIDER_SITE_OTHER): Payer: Medicare Other | Admitting: Pulmonary Disease

## 2015-05-05 VITALS — BP 138/78 | HR 108 | Temp 98.0°F

## 2015-05-05 DIAGNOSIS — J9611 Chronic respiratory failure with hypoxia: Secondary | ICD-10-CM | POA: Diagnosis not present

## 2015-05-05 DIAGNOSIS — J438 Other emphysema: Secondary | ICD-10-CM

## 2015-05-05 NOTE — Assessment & Plan Note (Signed)
The patient overall remains at a fairly stable baseline on his current medications. He has very severe disease, and is trying to remain as functional as possible. He is really on maximal medications, and I think only conditioning and weight loss are going to change his quality of life going forward.

## 2015-05-05 NOTE — Patient Instructions (Signed)
No change in breathing medications Try and work on leg strengthening, weight loss followup with Dr. Delton Coombes in 39mos.

## 2015-05-05 NOTE — Progress Notes (Signed)
   Subjective:    Patient ID: Trevor Carrillo, male    DOB: Jun 01, 1941, 74 y.o.   MRN: [id number]  HPI Patient comes in today for follow-up of his known severe COPD with chronic respiratory failure. He is on nebulized bronchodilator, as well as chronic prednisone at 5-10 mg a day. This has at least kept him at a stable baseline, and he has not had a recent pulmonary infection.  He is very limited in his activity because of dyspnea, but is fairly comfortable at rest. He denies any chest congestion or purulence at this time     Review of Systems  Constitutional: Negative for fever and unexpected weight change.  HENT: Negative for congestion, dental problem, ear pain, nosebleeds, postnasal drip, rhinorrhea, sinus pressure, sneezing, sore throat and trouble swallowing.   Eyes: Negative for redness and itching.  Respiratory: Positive for cough and shortness of breath. Negative for chest tightness and wheezing.   Cardiovascular: Negative for palpitations and leg swelling.  Gastrointestinal: Negative for nausea and vomiting.  Genitourinary: Negative for dysuria.  Musculoskeletal: Negative for joint swelling.  Skin: Negative for rash.  Neurological: Negative for headaches.  Hematological: Does not bruise/bleed easily.  Psychiatric/Behavioral: Negative for dysphoric mood. The patient is not nervous/anxious.        Objective:   Physical Exam Morbidly obese male in no acute distress Nose without purulence or discharge noted Neck without lymphadenopathy or thyromegaly Chest with very diminished breath sounds, a few basilar crackles, no wheezing Cardiac exam with regular rate and rhythm  lower extremities with 1+ edema, no cyanosis Alert and oriented, moves all 4 extremities.       Assessment & Plan:

## 2015-05-11 ENCOUNTER — Telehealth: Payer: Self-pay | Admitting: Pulmonary Disease

## 2015-05-11 MED ORDER — PREDNISONE 10 MG PO TABS: ORAL_TABLET | ORAL | Status: AC

## 2015-05-11 NOTE — Telephone Encounter (Signed)
Rx has been sent in. Nothing further was needed. 

## 2015-08-14 ENCOUNTER — Telehealth: Payer: Self-pay | Admitting: Emergency Medicine

## 2015-08-14 MED ORDER — BUDESONIDE 0.25 MG/2ML IN SUSP: 0.2500 mg | Freq: Four times a day (QID) | RESPIRATORY_TRACT | Status: AC

## 2015-08-14 NOTE — Telephone Encounter (Signed)
Spoke with pt's wife. States that the pt needs refills on Pulmicort neb solution. This has been refilled under [facility] >> pt has upcoming appointment with him. Nothing further was needed at this time.

## 2015-09-08 ENCOUNTER — Encounter: Payer: Self-pay | Admitting: Emergency Medicine

## 2015-09-08 ENCOUNTER — Ambulatory Visit (INDEPENDENT_AMBULATORY_CARE_PROVIDER_SITE_OTHER): Payer: Medicare Other | Admitting: Emergency Medicine

## 2015-09-08 VITALS — BP 122/72 | HR 105 | Ht 73.0 in | Wt 225.0 lb

## 2015-09-08 DIAGNOSIS — J9611 Chronic respiratory failure with hypoxia: Secondary | ICD-10-CM | POA: Diagnosis not present

## 2015-09-08 DIAGNOSIS — J438 Other emphysema: Secondary | ICD-10-CM | POA: Diagnosis not present

## 2015-09-08 NOTE — Assessment & Plan Note (Signed)
Oxygen at 4-5 L/m at all times

## 2015-09-08 NOTE — Patient Instructions (Signed)
Please continue your DuoNebs and Pulmicort Nebs as you have been taking them  Use albuterol inhaler as needed for shortness of breath Continue your oxygen as you have been using Flu shot today Follow with Dr Delton CoombesByrum in 4 months or sooner if you have any problems.

## 2015-09-08 NOTE — Progress Notes (Signed)
Subjective:    Patient ID: Trevor Carrillo, male    DOB: 1941/10/12, 74 y.o.   MRN: [id number]  HPI 74 year old man, former smoker (80 pack years) who has a history of COPD, hypertension, chronic hypoxemic respiratory failure. He has been followed by Dr Shelle Iron.  He is on DuoNeb 5x a day + pulmicort, albuterol prn. He is on 4-5L/min at all times. He is on prednisone  qd. He had difficulty this Summer, has tried to avoid the hot weather.  No wheezing, occasional cough can happen at any time, usually dry. He is having some nausea, may relate to meds?    Review of Systems As per HPI  Past Medical History  Diagnosis Date  . COPD (chronic obstructive pulmonary disease) (HCC)   . Panic disorder   . HTN (hypertension)   . ED (erectile dysfunction)      Family History  Problem Relation Age of Onset  . Prostate cancer Father      Social History   Social History  . Marital Status: Married    Spouse Name: N/A  . Number of Children: N/A  . Years of Education: N/A   Occupational History  . retired    Social History Main Topics  . Smoking status: Former Smoker -- 2.00 packs/day for 40 years    Types: Cigarettes    Quit date: 12/30/2004  . Smokeless tobacco: Not on file  . Alcohol Use: Yes     Comment: 2-3 glasses daily of wine  . Drug Use: No  . Sexual Activity: Not on file   Other Topics Concern  . Not on file   Social History Narrative     No Known Allergies   Outpatient Prescriptions Prior to Visit  Medication Sig Dispense Refill  . amLODipine (NORVASC) 5 MG tablet Take 5 mg by mouth daily.    Marland Kitchen b complex vitamins tablet Take 1 tablet by mouth daily.    . budesonide (PULMICORT) 0.25 MG/2ML nebulizer solution Take 2 mLs (0.25 mg total) by nebulization 4 (four) times daily. Dx J43.8 240 mL 6  . buPROPion (WELLBUTRIN XL) 300 MG 24 hr tablet Once daily  1  . fish oil-omega-3 fatty acids 1000 MG capsule Take 2 g by mouth daily.    . folic acid (FOLVITE) 400 MCG tablet  Take 400 mcg by mouth daily.    Marland Kitchen ipratropium-albuterol (DUONEB) 0.5-2.5 (3) MG/3ML SOLN Take 3mL treatments by nebulization every 4 hours. Can take additional 2 treatments for increased symptoms. DX J43.9 (Patient taking differently: Take 3mL treatments by nebulization 4 times daily. Can take additional 2 treatments for increased symptoms. DX J43.9) 360 mL 6  . LORazepam (ATIVAN) 2 MG tablet Take 2 mg by mouth 3 (three) times daily.     Marland Kitchen losartan (COZAAR) 100 MG tablet Take 100 mg by mouth daily.    . Multiple Vitamins-Iron (MULTI-VITAMIN/IRON PO) Take 1 tablet by mouth daily.    . predniSONE (DELTASONE) 10 MG tablet TAKE 1/2 TABLET EVERY DAY WITH FOOD 30 tablet 3  . VENTOLIN HFA 108 (90 BASE) MCG/ACT inhaler INHALE 2 PUFFS INTO THE LUNGS EVERY 6 (SIX) HOURS AS NEEDED FOR WHEEZING. 18 Inhaler 3  . vitamin C (ASCORBIC ACID) 500 MG tablet Take 500 mg by mouth daily.    Marland Kitchen albuterol (PROVENTIL HFA;VENTOLIN HFA) 108 (90 BASE) MCG/ACT inhaler Inhale 2 puffs into the lungs every 6 (six) hours as needed for wheezing. 1 Inhaler 6  . albuterol (PROVENTIL) (2.5 MG/3ML) 0.083% nebulizer  solution Take 2.5 mg by nebulization every 4 (four) hours.      No facility-administered medications prior to visit.         Objective:   Physical Exam Filed Vitals:   09/08/15 1637  BP: 122/72  Pulse: 105  Height: 6\' 1"  (1.854 m)  Weight: 225 lb (102.059 kg)  SpO2: 96%   Gen: Pleasant, overweight man in wheelchair, in no distress,  normal affect  ENT: No lesions,  mouth clear,  Edentulous,   Neck: No JVD, no stridor  Lungs: No use of accessory muscles, very distant, no wheeze, clear without rales or rhonchi  Cardiovascular: RRR, heart sounds normal, no murmur or gallops, trace lower extremity peripheral edema  Musculoskeletal: No deformities, no cyanosis or clubbing  Neuro: alert, non focal  Skin: Warm, no lesions or rashes     Assessment & Plan:  COPD (chronic obstructive pulmonary disease) with  emphysema Severe disease and significantly debilitated, oxygen dependent. We will continue his current medications as ordered including prednisone 5 mg daily. Flu shot today. Follow in 4 months or when necessary  Chronic respiratory failure Oxygen at 4-5 L/m at all times

## 2015-09-08 NOTE — Assessment & Plan Note (Signed)
Severe disease and significantly debilitated, oxygen dependent. We will continue his current medications as ordered including prednisone 5 mg daily. Flu shot today. Follow in 4 months or when necessary

## 2015-10-10 ENCOUNTER — Telehealth: Payer: Self-pay | Admitting: Emergency Medicine

## 2015-10-10 MED ORDER — LEVOFLOXACIN 500 MG PO TABS: 500.0000 mg | ORAL_TABLET | Freq: Every day | ORAL | Status: AC

## 2015-10-26 NOTE — Telephone Encounter (Signed)
Spoke with pt's wife, states pt is having increased SOB, nonprod cough, sore throat, chest soreness since yesterday.  Denies fever, mucus production.  Has used Duoneb to help with SOB and chest tightness.  Pt's wife requesting something be called in for pt.   [facility] [address].    Sending to doc of the day as [facility] is unavailable today.  SN please advise.  Thanks!

## 2015-10-26 NOTE — Telephone Encounter (Signed)
Per SN >> Levaquin 500mg  1 daily #10, Align OTC, Tylenol/Aleve for pain, bump prednisone up to 10mg  daily.  Spoke with pt's wife. She is aware of SN's recommendations. Rx has been called in. Nothing further was needed.

## 2015-10-26 DEATH — deceased
